# Patient Record
Sex: Female | Born: 1955 | Race: Black or African American | Hispanic: No | Smoking: Former smoker
Health system: Southern US, Community
[De-identification: ages and names within clinical notes are randomized; demographics above are authoritative.]

## PROBLEM LIST (undated history)

## (undated) DIAGNOSIS — I10 Essential (primary) hypertension: Secondary | ICD-10-CM

## (undated) HISTORY — PX: ABDOMINAL HYSTERECTOMY: SHX81

---

## 2004-07-04 ENCOUNTER — Ambulatory Visit: Payer: Self-pay | Admitting: Otolaryngology

## 2004-11-15 ENCOUNTER — Ambulatory Visit: Payer: Self-pay | Admitting: Family Medicine

## 2005-09-03 ENCOUNTER — Emergency Department: Payer: Self-pay | Admitting: Emergency Medicine

## 2005-11-05 ENCOUNTER — Ambulatory Visit: Payer: Self-pay

## 2006-03-12 ENCOUNTER — Ambulatory Visit: Payer: Self-pay | Admitting: Obstetrics and Gynecology
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
## 2008-05-26 ENCOUNTER — Ambulatory Visit: Payer: Self-pay | Admitting: Family Medicine

## 2008-06-06 ENCOUNTER — Ambulatory Visit: Payer: Self-pay | Admitting: Gastroenterology

## 2020-08-22 ENCOUNTER — Emergency Department: Payer: No Typology Code available for payment source

## 2020-08-22 ENCOUNTER — Emergency Department
Admission: EM | Admit: 2020-08-22 | Discharge: 2020-08-22 | Disposition: A | Discharge status: Home / Self Care | Payer: No Typology Code available for payment source | Attending: Emergency Medicine | Admitting: Emergency Medicine

## 2020-08-22 ENCOUNTER — Other Ambulatory Visit: Payer: Self-pay

## 2020-08-22 DIAGNOSIS — U071 COVID-19: Secondary | ICD-10-CM | POA: Diagnosis not present

## 2020-08-22 DIAGNOSIS — Z87891 Personal history of nicotine dependence: Secondary | ICD-10-CM | POA: Diagnosis not present

## 2020-08-22 DIAGNOSIS — I10 Essential (primary) hypertension: Secondary | ICD-10-CM | POA: Insufficient documentation

## 2020-08-22 DIAGNOSIS — J9801 Acute bronchospasm: Secondary | ICD-10-CM | POA: Insufficient documentation

## 2020-08-22 DIAGNOSIS — R059 Cough, unspecified: Secondary | ICD-10-CM | POA: Diagnosis present

## 2020-08-22 HISTORY — DX: Essential (primary) hypertension: I10

## 2020-08-22 MED ORDER — IPRATROPIUM-ALBUTEROL 0.5-2.5 (3) MG/3ML IN SOLN
3.0000 mL | Freq: Once | RESPIRATORY_TRACT | Status: AC
  Administered 2020-08-22: 3 mL via RESPIRATORY_TRACT
  Filled 2020-08-22: qty 3

## 2020-08-22 MED ORDER — PREDNISONE 20 MG PO TABS: 60.0000 mg | ORAL_TABLET | Freq: Every day | ORAL | 0 refills | Status: AC

## 2020-08-22 MED ORDER — ALBUTEROL SULFATE HFA 108 (90 BASE) MCG/ACT IN AERS: 2.0000 | INHALATION_SPRAY | Freq: Four times a day (QID) | RESPIRATORY_TRACT | 0 refills | Status: AC | PRN

## 2020-08-22 NOTE — ED Provider Notes (Signed)
[facility] Emergency Department Provider Note   ____________________________________________   Event Date/Time   First MD Initiated Contact with Patient 08/22/20 1716     (approximate)  I have reviewed the triage vital signs and the nursing notes.   HISTORY  Chief Complaint Cough, Shortness of Breath, and Wheezing    HPI Rachel Hutchinson is a 65 y.o. female with past medical history of hypertension who presents to the ED complaining of cough and shortness of breath.  Patient reports that she has been dealing with a cough for the past 8 months that was initially dry, but has become more severe and productive of clear sputum over the past couple of days.  She occasionally can feel like she has trouble catching her breath, but she denies any difficulty breathing at rest.  She has not had any fevers, chest pain, pain or swelling in her legs.  She previously had a telehealth visit and was prescribed Augmentin as well as Tessalon Perles with partial relief.  She has not seen her PCP for this problem.        Past Medical History:  Diagnosis Date  . Hypertension     There are no problems to display for this patient.   Past Surgical History:  Procedure Laterality Date  . ABDOMINAL HYSTERECTOMY      Prior to Admission medications   Medication Sig Start Date End Date Taking? Authorizing Provider  albuterol (VENTOLIN HFA) 108 (90 Base) MCG/ACT inhaler Inhale 2 puffs into the lungs every 6 (six) hours as needed for wheezing or shortness of breath. 08/22/20  Yes Chesley Noon, MD  predniSONE (DELTASONE) 20 MG tablet Take 3 tablets (60 mg total) by mouth daily with breakfast for 5 days. 08/22/20 08/27/20 Yes Chesley Noon, MD    Allergies Patient has no allergy information on record.  History reviewed. No pertinent family history.  Social History Social History   Tobacco Use  . Smoking status: Former Games developer  . Smokeless tobacco: Never Used  Vaping Use   . Vaping Use: Never used  Substance Use Topics  . Alcohol use: Not Currently  . Drug use: Never    Review of Systems  Constitutional: No fever/chills Eyes: No visual changes. ENT: No sore throat. Cardiovascular: Denies chest pain. Respiratory: Positive for cough.  Shortness of breath. Gastrointestinal: No abdominal pain.  No nausea, no vomiting.  No diarrhea.  No constipation. Genitourinary: Negative for dysuria. Musculoskeletal: Negative for back pain. Skin: Negative for rash. Neurological: Negative for headaches, focal weakness or numbness.  ____________________________________________   PHYSICAL EXAM:  VITAL SIGNS: ED Triage Vitals  Enc Vitals Group     BP 08/22/20 1712 (!) 158/88     Pulse Rate 08/22/20 1712 83     Resp 08/22/20 1712 20     Temp 08/22/20 1712 97.9 F (36.6 C)     Temp Source 08/22/20 1712 Oral     SpO2 08/22/20 1712 97 %     Weight 08/22/20 1713 255 lb (115.7 kg)     Height 08/22/20 1713 5' 5.5" (1.664 m)     Head Circumference --      Peak Flow --      Pain Score 08/22/20 1713 0     Pain Loc --      Pain Edu? --      Excl. in GC? --     Constitutional: Alert and oriented. Eyes: Conjunctivae are normal. Head: Atraumatic. Nose: No congestion/rhinnorhea. Mouth/Throat: Mucous membranes are moist.  Neck: Normal ROM Cardiovascular: Normal rate, regular rhythm. Grossly normal heart sounds. Respiratory: Normal respiratory effort.  No retractions. Lungs with faint end expiratory wheezing. Gastrointestinal: Soft and nontender. No distention. Genitourinary: deferred Musculoskeletal: No lower extremity tenderness nor edema. Neurologic:  Normal speech and language. No gross focal neurologic deficits are appreciated. Skin:  Skin is warm, dry and intact. No rash noted. Psychiatric: Mood and affect are normal. Speech and behavior are normal.  ____________________________________________   LABS (all labs ordered are listed, but only abnormal results  are displayed)  Labs Reviewed  SARS CORONAVIRUS 2 (TAT 6-24 HRS)   ____________________________________________  EKG  ED ECG REPORT I, Chesley Noon, the attending physician, personally viewed and interpreted this ECG.   Date: 08/22/2020  EKG Time: 17:28  Rate: 82  Rhythm: normal sinus rhythm  Axis: LAD  Intervals:none  ST&T Change: None   PROCEDURES  Procedure(s) performed (including Critical Care):  Procedures   ____________________________________________   INITIAL IMPRESSION / ASSESSMENT AND PLAN / ED COURSE       65 year old female with past medical history of hypertension presents to the ED with cough for the past 8 months that has worsened over the past couple of days with some mild dyspnea on exertion.  She is not in any respiratory distress on my evaluation, is maintaining O2 sats on room air.  EKG shows no evidence of arrhythmia or ischemia, we will check chest x-ray.  She does have some mild end expiratory wheezing and we will treat with DuoNeb.  Chest x-ray reviewed by me and shows no infiltrate, edema, or effusion, does show peribronchial cuffing consistent with asthma per radiology.  Patient does report symptoms significantly improved following DuoNeb and I suspect her symptoms are due to bronchospasm.  She is appropriate for discharge home with PCP follow-up with the [facility], we will prescribe inhaler as well as a course of prednisone.  Patient counseled to return to the ED for new worsening symptoms, patient agrees with plan.      ____________________________________________   FINAL CLINICAL IMPRESSION(S) / ED DIAGNOSES  Final diagnoses:  Bronchospasm     ED Discharge Orders         Ordered    predniSONE (DELTASONE) 20 MG tablet  Daily with breakfast        08/22/20 1854    albuterol (VENTOLIN HFA) 108 (90 Base) MCG/ACT inhaler  Every 6 hours PRN       Note to Pharmacy: Please supply with spacer   08/22/20 1854           Note:  This  document was prepared using Dragon voice recognition software and may include unintentional dictation errors.   Chesley Noon, MD 08/22/20 [phone]

## 2020-08-22 NOTE — ED Notes (Signed)
Pt presenting with audible expiratory wheeze

## 2020-08-22 NOTE — ED Triage Notes (Signed)
Pt arrives via pov from home for cough and sob. Had televisit with doctor and given medication in march with temporary improvement. Pt reports cough, SOB, fever, chills, body aches starting 3 days ago. Audible wheezing can be heard during triage assessment. Becomes slightly winded with exertion. Takes BP medications at home. No respiratory or other distress when pt sitting on stretcher at this time.

## 2020-08-22 NOTE — ED Notes (Addendum)
Pt stated that she has had a cough for 8 months; c/o SOB today and tightness in chest and lungs when walking; pt stated that she has some mucous production with cough and nasal congestion, pt has been taking cold and flu medicine

## 2020-08-23 LAB — SARS CORONAVIRUS 2 (TAT 6-24 HRS): SARS Coronavirus 2: POSITIVE — AB
